# Patient Record
Sex: Female | Born: 1970 | Race: White | Hispanic: No | Marital: Married | Smoking: Never smoker
Health system: Southern US, Community
[De-identification: ages and names within clinical notes are randomized; demographics above are authoritative.]

## PROBLEM LIST (undated history)

## (undated) DIAGNOSIS — K219 Gastro-esophageal reflux disease without esophagitis: Secondary | ICD-10-CM

## (undated) DIAGNOSIS — R000 Tachycardia, unspecified: Secondary | ICD-10-CM

## (undated) DIAGNOSIS — I10 Essential (primary) hypertension: Secondary | ICD-10-CM

## (undated) DIAGNOSIS — F419 Anxiety disorder, unspecified: Secondary | ICD-10-CM

---

## 2017-09-24 ENCOUNTER — Emergency Department (HOSPITAL_BASED_OUTPATIENT_CLINIC_OR_DEPARTMENT_OTHER): Payer: BC Managed Care – PPO

## 2017-09-24 ENCOUNTER — Encounter (HOSPITAL_BASED_OUTPATIENT_CLINIC_OR_DEPARTMENT_OTHER): Payer: Self-pay | Admitting: Emergency Medicine

## 2017-09-24 ENCOUNTER — Other Ambulatory Visit: Payer: Self-pay

## 2017-09-24 ENCOUNTER — Emergency Department (HOSPITAL_BASED_OUTPATIENT_CLINIC_OR_DEPARTMENT_OTHER)
Admission: EM | Admit: 2017-09-24 | Discharge: 2017-09-24 | Disposition: A | Discharge status: Home / Self Care | Payer: BC Managed Care – PPO | Attending: Emergency Medicine | Admitting: Emergency Medicine

## 2017-09-24 DIAGNOSIS — S4992XA Unspecified injury of left shoulder and upper arm, initial encounter: Secondary | ICD-10-CM | POA: Diagnosis present

## 2017-09-24 DIAGNOSIS — W010XXA Fall on same level from slipping, tripping and stumbling without subsequent striking against object, initial encounter: Secondary | ICD-10-CM | POA: Diagnosis not present

## 2017-09-24 DIAGNOSIS — Y939 Activity, unspecified: Secondary | ICD-10-CM | POA: Insufficient documentation

## 2017-09-24 DIAGNOSIS — Z79899 Other long term (current) drug therapy: Secondary | ICD-10-CM | POA: Diagnosis not present

## 2017-09-24 DIAGNOSIS — Y92481 Parking lot as the place of occurrence of the external cause: Secondary | ICD-10-CM | POA: Diagnosis not present

## 2017-09-24 DIAGNOSIS — Y999 Unspecified external cause status: Secondary | ICD-10-CM | POA: Diagnosis not present

## 2017-09-24 DIAGNOSIS — I10 Essential (primary) hypertension: Secondary | ICD-10-CM | POA: Insufficient documentation

## 2017-09-24 DIAGNOSIS — S42212A Unspecified displaced fracture of surgical neck of left humerus, initial encounter for closed fracture: Secondary | ICD-10-CM

## 2017-09-24 HISTORY — DX: Tachycardia, unspecified: R00.0

## 2017-09-24 HISTORY — DX: Anxiety disorder, unspecified: F41.9

## 2017-09-24 HISTORY — DX: Essential (primary) hypertension: I10

## 2017-09-24 HISTORY — DX: Gastro-esophageal reflux disease without esophagitis: K21.9

## 2017-09-24 MED ORDER — OXYCODONE-ACETAMINOPHEN 5-325 MG PO TABS
1.0000 | ORAL_TABLET | Freq: Once | ORAL | Status: AC
  Administered 2017-09-24: 1 via ORAL
  Filled 2017-09-24: qty 1

## 2017-09-24 MED ORDER — OXYCODONE-ACETAMINOPHEN 5-325 MG PO TABS: 1.0000 | ORAL_TABLET | ORAL | 0 refills | Status: AC | PRN

## 2017-09-24 MED ORDER — ONDANSETRON 4 MG PO TBDP
4.0000 mg | ORAL_TABLET | Freq: Once | ORAL | Status: AC
  Administered 2017-09-24: 4 mg via ORAL
  Filled 2017-09-24: qty 1

## 2017-09-24 MED ORDER — MORPHINE SULFATE (PF) 4 MG/ML IV SOLN
4.0000 mg | Freq: Once | INTRAVENOUS | Status: DC
  Filled 2017-09-24: qty 1

## 2017-09-24 NOTE — ED Provider Notes (Signed)
[facility] EMERGENCY DEPARTMENT Provider Note   CSN: [id number] Arrival date & time: 09/24/17  1547     History   Chief Complaint Chief Complaint  Patient presents with  . Fall    HPI Rachel Delacruz is a 47 y.o. female.  Pt presents to the ED today with left shoulder pain s/p fall.  Pt said she was in [employer] parking lot at [facility] and tripped over a hole.  She landed on her left shoulder.  She is right handed.  No numbness in right hand.  She had a little bit of right ankle pain initially, but that pain is gone now.  Pt denies hitting her head or loc.     Past Medical History:  Diagnosis Date  . Anxiety   . GERD (gastroesophageal reflux disease)   . Hypertension   . Sinus tachycardia     There are no active problems to display for this patient.   History reviewed. No pertinent surgical history.   OB History   None      Home Medications    Prior to Admission medications   Medication Sig Start Date End Date Taking? Authorizing Provider  BORIC ACID EX Place 1 capsule vaginally once. 09/15/17  Yes [provider]  escitalopram (LEXAPRO) 20 MG tablet Take 20 mg by mouth once. 04/19/17  Yes [provider]  metoprolol (TOPROL-XL) 200 MG 24 hr tablet Take 200 mg by mouth daily. 08/12/17  Yes [provider]  omeprazole (PRILOSEC) 20 MG capsule Take 20 mg by mouth. 11/18/16  Yes [provider]  oxyCODONE-acetaminophen (PERCOCET/ROXICET) 5-325 MG tablet Take 1 tablet by mouth every 4 (four) hours as needed for severe pain. 09/24/17   Jacalyn Lefevre, MD    Family History History reviewed. No pertinent family history.  Social History Social History   Tobacco Use  . Smoking status: Never Smoker  . Smokeless tobacco: Never Used  Substance Use Topics  . Alcohol use: Never    Frequency: Never  . Drug use: Never     Allergies   Azithromycin; Clarithromycin; Lisinopril; and Penicillins   Review of  Systems Review of Systems  Musculoskeletal:       Left shoulder pain  All other systems reviewed and are negative.    Physical Exam Updated Vital Signs BP 140/65 (BP Location: Right Arm)   Pulse 68   Resp 18   Ht  (1.651 m)   Wt (!) 144.2 kg (318 lb)   LMP 08/25/2017   SpO2 100%   BMI 52.92 kg/m   Physical Exam  Constitutional: She is oriented to person, place, and time. She appears well-developed and well-nourished.  HENT:  Head: Normocephalic and atraumatic.  Right Ear: External ear normal.  Left Ear: External ear normal.  Nose: Nose normal.  Mouth/Throat: Oropharynx is clear and moist.  Eyes: Pupils are equal, round, and reactive to light. Conjunctivae and EOM are normal.  Neck: Normal range of motion. Neck supple.  Cardiovascular: Normal rate, regular rhythm, normal heart sounds and intact distal pulses.  Pulmonary/Chest: Effort normal and breath sounds normal.  Abdominal: Soft. Bowel sounds are normal.  Musculoskeletal:       Left shoulder: She exhibits decreased range of motion, bony tenderness and swelling.  Neurological: She is alert and oriented to person, place, and time.  Skin: Skin is warm. Capillary refill takes less than 2 seconds.  Psychiatric: She has a normal mood and affect. Her behavior is normal. Judgment and thought  content normal.  Nursing note and vitals reviewed.    ED Treatments / Results  Labs (all labs ordered are listed, but only abnormal results are displayed) Labs Reviewed - No data to display  EKG None  Radiology Dg Shoulder Left  Result Date: 09/24/2017 CLINICAL DATA:  Status post fall. Pain and decreased range of motion of the left shoulder. EXAM: LEFT SHOULDER - 2+ VIEW COMPARISON:  None. FINDINGS: There is a comminuted mildly impacted sub capitellar left humeral neck fracture. The humeral head appears normally located within the acetabulum. IMPRESSION: Comminuted mildly impacted left humeral neck fracture. Electronically  Signed   By: Ted Mcalpine M.D.   On: 09/24/2017 17:10    Procedures Procedures (including critical care time)  Medications Ordered in ED Medications  ondansetron (ZOFRAN-ODT) disintegrating tablet 4 mg (4 mg Oral Given 09/24/17 1721)  oxyCODONE-acetaminophen (PERCOCET/ROXICET) 5-325 MG per tablet 1 tablet (1 tablet Oral Given 09/24/17 1721)     Initial Impression / Assessment and Plan / ED Course  I have reviewed the triage vital signs and the nursing notes.  Pertinent labs & imaging results that were available during my care of the patient were reviewed by me and considered in my medical decision making (see chart for details).     Pt did not want an xray of her ankle.  Shoulder fracture treated with immobilization (sling and swathe).  She wants to f/u with [facility], so she is given their number upon d/c.  Return if worse.  Final Clinical Impressions(s) / ED Diagnoses   Final diagnoses:  Fx humeral neck, left, closed, initial encounter    ED Discharge Orders        Ordered    oxyCODONE-acetaminophen (PERCOCET/ROXICET) 5-325 MG tablet  Every 4 hours PRN     09/24/17 1800       Jacalyn Lefevre, MD 09/24/17 1801

## 2017-09-24 NOTE — ED Notes (Signed)
Refused xray to right ankle, states is not hurting

## 2017-09-24 NOTE — ED Notes (Signed)
Patient transported to X-ray 

## 2017-09-24 NOTE — ED Triage Notes (Signed)
Patient states that she was walking into [employer] at [facility] and she fell. She hit her left shoulder and twisted her right ankle

## 2018-08-05 IMAGING — DX DG SHOULDER 2+V*L*
3 series · 3 of 3 positions shown · non-contrast
Comparison: None.

CLINICAL DATA: Status post fall. Pain and decreased range of motion
of the left shoulder.

EXAM:
LEFT SHOULDER - 2+ VIEW

[shoulder grashey]
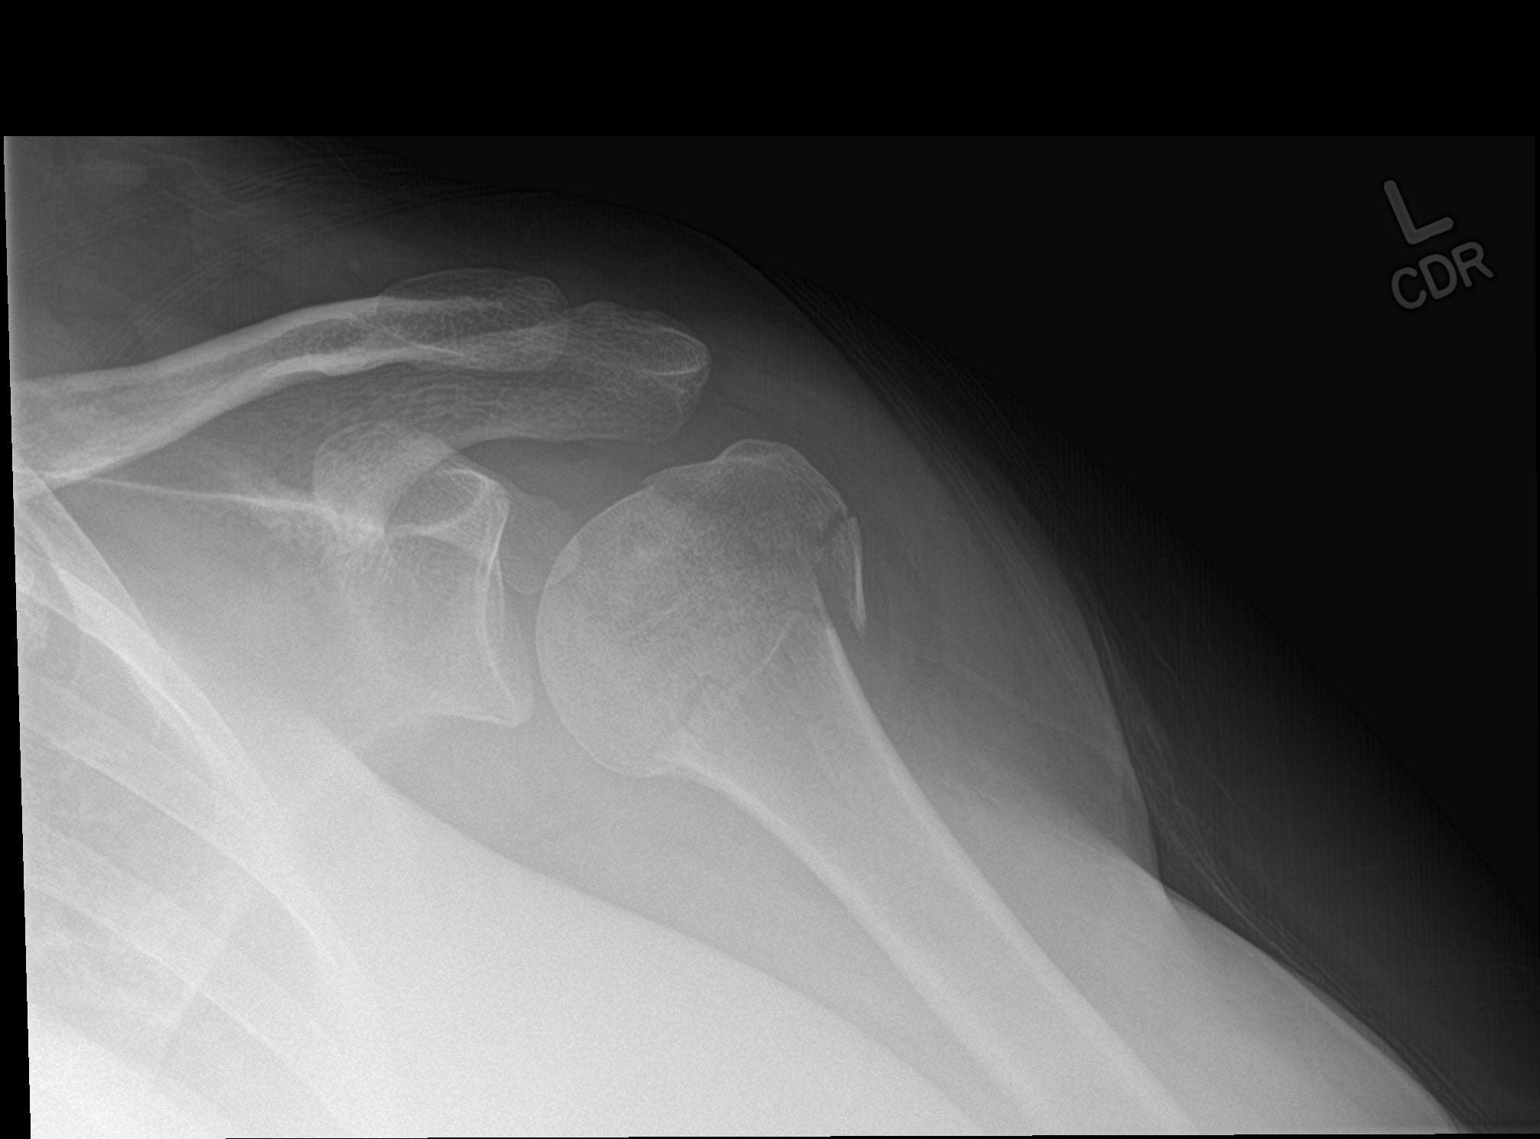

[shoulder y view]
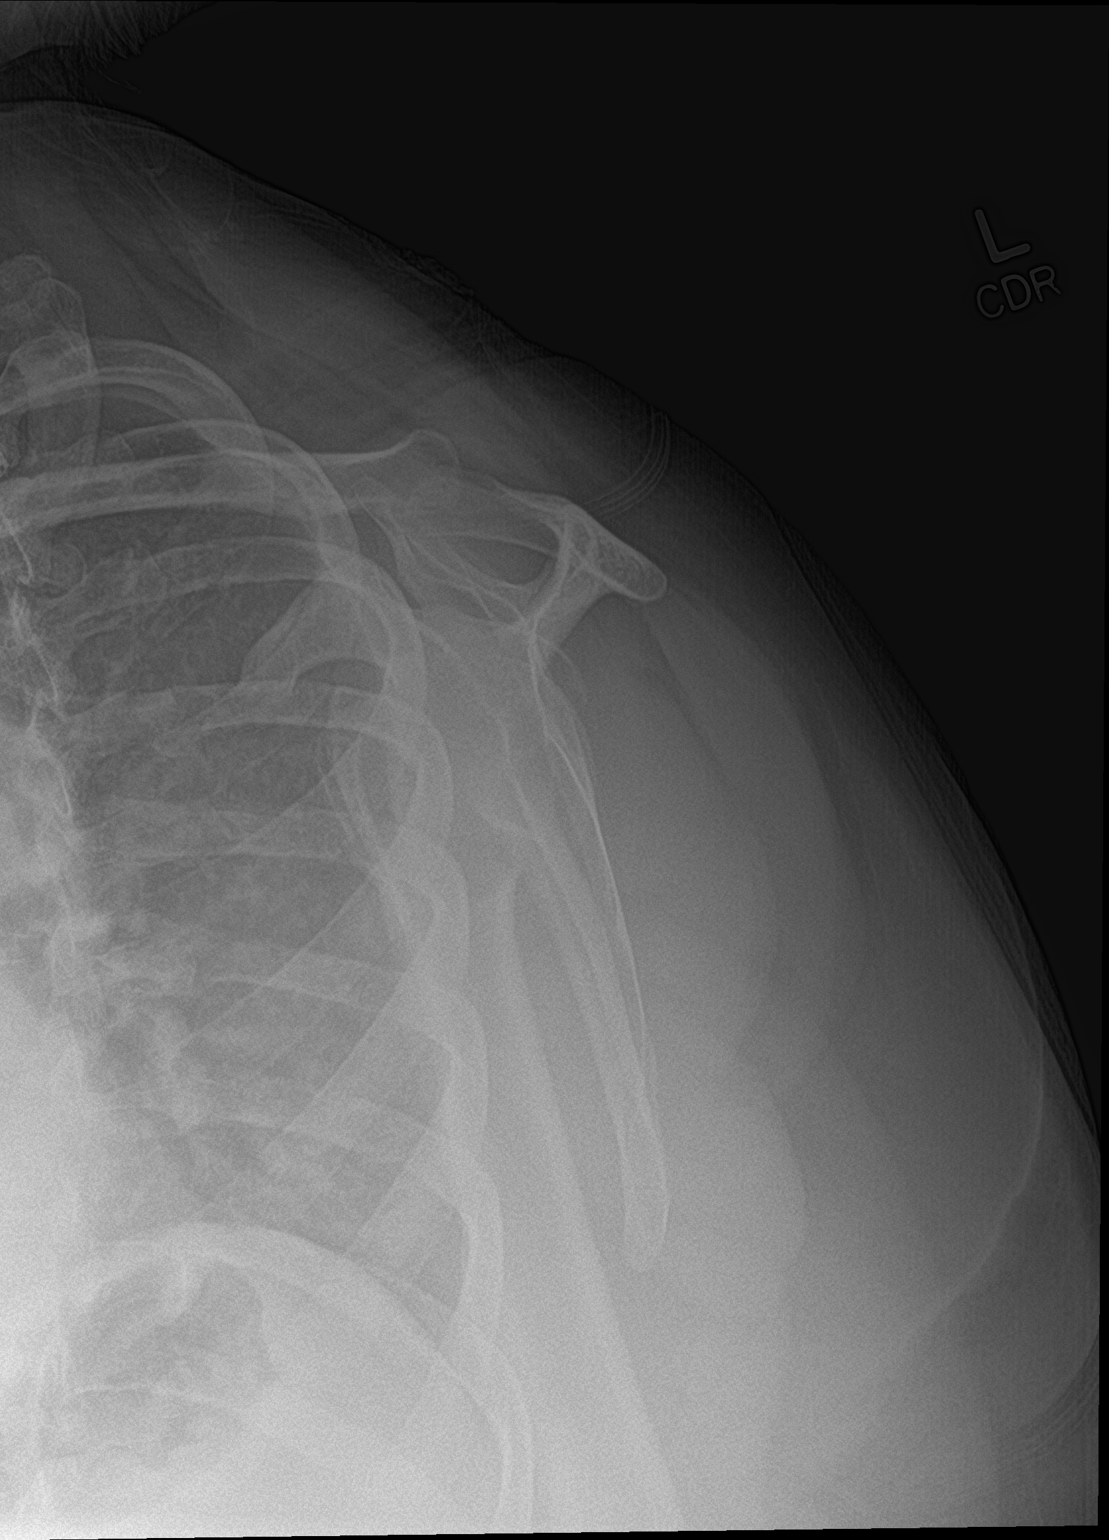

[shoulder ap neutral]
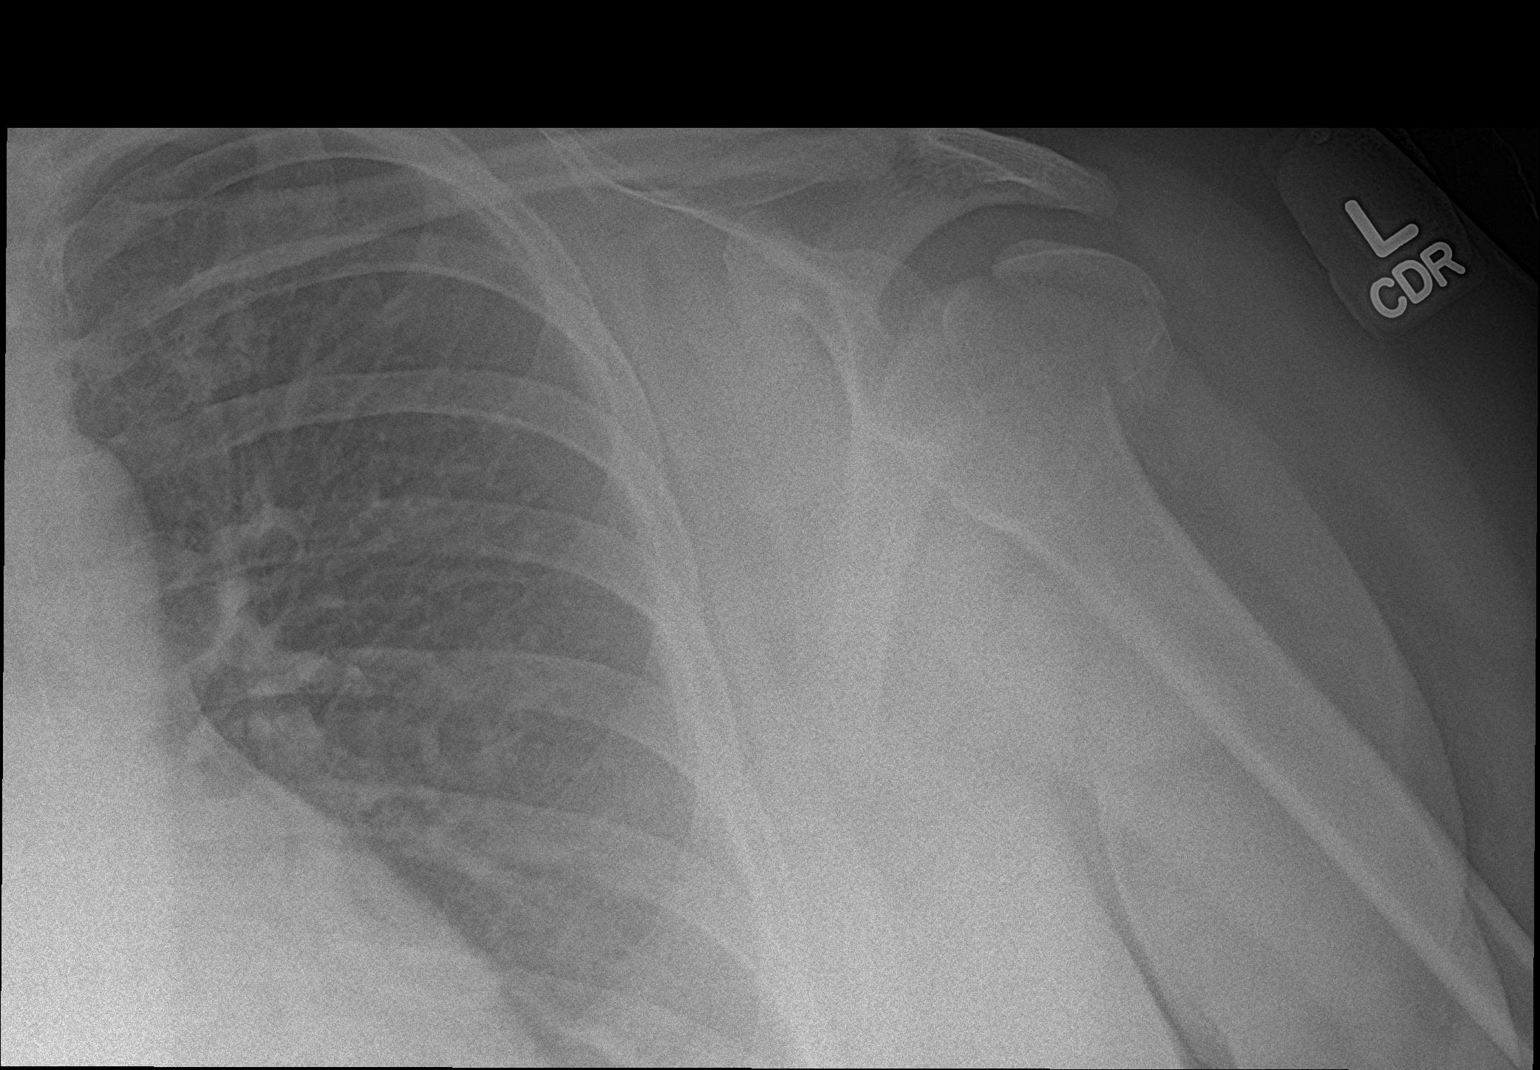

[3 of 3 positions shown; findings below may reference images not displayed]

FINDINGS: There is a comminuted mildly impacted sub capitellar left humeral
neck fracture. The humeral head appears normally located within the
acetabulum.
IMPRESSION: Comminuted mildly impacted left humeral neck fracture.

## 2021-08-01 DEATH — deceased
